# Patient Record
Sex: Female | Born: 1940 | Race: White | Hispanic: No | Smoking: Former smoker
Health system: Southern US, Community
[De-identification: ages and names within clinical notes are randomized; demographics above are authoritative.]

## PROBLEM LIST (undated history)

## (undated) DIAGNOSIS — K219 Gastro-esophageal reflux disease without esophagitis: Secondary | ICD-10-CM

## (undated) DIAGNOSIS — R0602 Shortness of breath: Secondary | ICD-10-CM

## (undated) DIAGNOSIS — G4730 Sleep apnea, unspecified: Secondary | ICD-10-CM

## (undated) DIAGNOSIS — Z7983 Long term (current) use of bisphosphonates: Secondary | ICD-10-CM

## (undated) DIAGNOSIS — R51 Headache: Secondary | ICD-10-CM

## (undated) DIAGNOSIS — M1990 Unspecified osteoarthritis, unspecified site: Secondary | ICD-10-CM

## (undated) DIAGNOSIS — J189 Pneumonia, unspecified organism: Secondary | ICD-10-CM

## (undated) DIAGNOSIS — Z5181 Encounter for therapeutic drug level monitoring: Secondary | ICD-10-CM

## (undated) HISTORY — PX: DILATION AND CURETTAGE OF UTERUS: SHX78

## (undated) HISTORY — PX: ROTATOR CUFF REPAIR: SHX139

## (undated) HISTORY — PX: TONSILLECTOMY: SUR1361

## (undated) HISTORY — PX: JOINT REPLACEMENT: SHX530

---

## 2012-01-27 ENCOUNTER — Encounter (HOSPITAL_COMMUNITY): Payer: Self-pay

## 2012-02-07 ENCOUNTER — Encounter (HOSPITAL_COMMUNITY)
Admission: RE | Admit: 2012-02-07 | Discharge: 2012-02-07 | Disposition: A | Discharge status: Home / Self Care | Payer: Medicare Other | Source: Ambulatory Visit | Attending: Orthopedic Surgery | Admitting: Orthopedic Surgery

## 2012-02-07 ENCOUNTER — Encounter (HOSPITAL_COMMUNITY): Payer: Self-pay

## 2012-02-07 ENCOUNTER — Encounter (HOSPITAL_COMMUNITY)
Admission: RE | Admit: 2012-02-07 | Discharge: 2012-02-07 | Disposition: A | Discharge status: Home / Self Care | Payer: Medicare Other | Source: Ambulatory Visit | Attending: Anesthesiology | Admitting: Anesthesiology

## 2012-02-07 ENCOUNTER — Other Ambulatory Visit: Payer: Self-pay

## 2012-02-07 HISTORY — DX: Headache: R51

## 2012-02-07 HISTORY — DX: Unspecified osteoarthritis, unspecified site: M19.90

## 2012-02-07 HISTORY — DX: Encounter for therapeutic drug level monitoring: Z51.81

## 2012-02-07 HISTORY — DX: Gastro-esophageal reflux disease without esophagitis: K21.9

## 2012-02-07 HISTORY — DX: Sleep apnea, unspecified: G47.30

## 2012-02-07 HISTORY — DX: Shortness of breath: R06.02

## 2012-02-07 HISTORY — DX: Encounter for therapeutic drug level monitoring: Z79.83

## 2012-02-07 HISTORY — DX: Pneumonia, unspecified organism: J18.9

## 2012-02-07 LAB — BASIC METABOLIC PANEL
BUN: 8 mg/dL (ref 6–23)
Chloride: 102 mEq/L (ref 96–112)
GFR calc Af Amer: >90 mL/min (ref >90)
Potassium: 3.9 mEq/L (ref 3.5–5.1)

## 2012-02-07 LAB — TYPE AND SCREEN: Antibody Screen: NEGATIVE

## 2012-02-07 LAB — CBC
HCT: 39.1 % (ref 36.0–46.0)
Hemoglobin: 13.5 g/dL (ref 12.0–15.0)
WBC: 5.1 10*3/uL (ref 4.0–10.5)

## 2012-02-07 LAB — ABO/RH: ABO/RH(D): A POS

## 2012-02-07 NOTE — Progress Notes (Signed)
Requested last office note, pulmonary tests, sleep study from [facility] in [place].

## 2012-02-07 NOTE — Pre-Procedure Instructions (Signed)
20 Jackie Keller  02/07/2012   Your procedure is scheduled on:   Thursday  02/09/12    Report to [facility] at 830  AM.  Call this number if you have problems the morning of surgery: [phone]   Remember:   Do not eat food:After Midnight.  May have clear liquids: up to 4 Hours before arrival.  Clear liquids include soda, tea, black coffee, apple or grape juice, broth.  Take these medicines the morning of surgery with A SIP OF WATER:  Tylenol , inhaler (albuterol) , nebulizer, symbicort, nexium, flonase   Do not wear jewelry, make-up or nail polish.  Do not wear lotions, powders, or perfumes. You may wear deodorant.  Do not shave 48 hours prior to surgery.  Do not bring valuables to the hospital.  Contacts, dentures or bridgework may not be worn into surgery.  Leave suitcase in the car. After surgery it may be brought to your room.  For patients admitted to the hospital, checkout time is 11:00 AM the day of discharge.   Patients discharged the day of surgery will not be allowed to drive home.  Name and phone number of your driver:   Special Instructions: CHG Shower Use Special Wash: 1/2 bottle night before surgery and 1/2 bottle morning of surgery.   Please read over the following fact sheets that you were given: Pain Booklet, MRSA Information and Surgical Site Infection Prevention

## 2012-02-08 MED ORDER — VANCOMYCIN HCL IN DEXTROSE 1-5 GM/200ML-% IV SOLN
1000.0000 mg | Freq: Once | INTRAVENOUS | Status: AC
  Administered 2012-02-09: 1000 mg via INTRAVENOUS
  Filled 2012-02-08: qty 200

## 2012-02-08 MED ORDER — LACTATED RINGERS IV SOLN
INTRAVENOUS | Status: DC
  Administered 2012-02-09: 10:00:00 via INTRAVENOUS

## 2012-02-08 MED ORDER — CHLORHEXIDINE GLUCONATE 4 % EX LIQD
60.0000 mL | Freq: Once | CUTANEOUS | Status: DC
  Filled 2012-02-08: qty 60

## 2012-02-09 ENCOUNTER — Encounter (HOSPITAL_COMMUNITY): Payer: Self-pay | Admitting: Anesthesiology

## 2012-02-09 ENCOUNTER — Encounter (HOSPITAL_COMMUNITY)
Admission: RE | Disposition: A | Discharge status: Home Health | Payer: Self-pay | Source: Ambulatory Visit | Attending: Orthopedic Surgery

## 2012-02-09 ENCOUNTER — Encounter (HOSPITAL_COMMUNITY): Payer: Self-pay | Admitting: *Deleted

## 2012-02-09 ENCOUNTER — Inpatient Hospital Stay (HOSPITAL_COMMUNITY)
Admission: RE | Admit: 2012-02-09 | Discharge: 2012-02-11 | DRG: 484 | Disposition: A | Discharge status: Home Health | Payer: Medicare Other | Source: Ambulatory Visit | Attending: Orthopedic Surgery | Admitting: Orthopedic Surgery

## 2012-02-09 ENCOUNTER — Ambulatory Visit (HOSPITAL_COMMUNITY): Payer: Medicare Other | Admitting: Anesthesiology

## 2012-02-09 DIAGNOSIS — K219 Gastro-esophageal reflux disease without esophagitis: Secondary | ICD-10-CM | POA: Diagnosis present

## 2012-02-09 DIAGNOSIS — M1990 Unspecified osteoarthritis, unspecified site: Secondary | ICD-10-CM

## 2012-02-09 DIAGNOSIS — Z888 Allergy status to other drugs, medicaments and biological substances status: Secondary | ICD-10-CM

## 2012-02-09 DIAGNOSIS — J45909 Unspecified asthma, uncomplicated: Secondary | ICD-10-CM | POA: Diagnosis present

## 2012-02-09 DIAGNOSIS — Z96659 Presence of unspecified artificial knee joint: Secondary | ICD-10-CM

## 2012-02-09 DIAGNOSIS — X58XXXA Exposure to other specified factors, initial encounter: Secondary | ICD-10-CM | POA: Diagnosis present

## 2012-02-09 DIAGNOSIS — G43909 Migraine, unspecified, not intractable, without status migrainosus: Secondary | ICD-10-CM | POA: Diagnosis present

## 2012-02-09 DIAGNOSIS — M810 Age-related osteoporosis without current pathological fracture: Secondary | ICD-10-CM | POA: Diagnosis present

## 2012-02-09 DIAGNOSIS — G4730 Sleep apnea, unspecified: Secondary | ICD-10-CM | POA: Diagnosis present

## 2012-02-09 DIAGNOSIS — M129 Arthropathy, unspecified: Secondary | ICD-10-CM | POA: Diagnosis present

## 2012-02-09 DIAGNOSIS — S43429A Sprain of unspecified rotator cuff capsule, initial encounter: Principal | ICD-10-CM | POA: Diagnosis present

## 2012-02-09 HISTORY — PX: REVERSE SHOULDER ARTHROPLASTY: SHX5054

## 2012-02-09 SURGERY — ARTHROPLASTY, SHOULDER, TOTAL, REVERSE
Anesthesia: General | Site: Shoulder | Laterality: Right | Wound class: Clean

## 2012-02-09 MED ORDER — ONDANSETRON HCL 4 MG PO TABS: 4.0000 mg | ORAL_TABLET | Freq: Four times a day (QID) | ORAL | Status: DC | PRN

## 2012-02-09 MED ORDER — BUDESONIDE-FORMOTEROL FUMARATE 80-4.5 MCG/ACT IN AERO
2.0000 | INHALATION_SPRAY | Freq: Two times a day (BID) | RESPIRATORY_TRACT | Status: DC
  Filled 2012-02-09: qty 6.9

## 2012-02-09 MED ORDER — SODIUM CHLORIDE 0.9 % IR SOLN
Status: DC | PRN
  Administered 2012-02-09: 1000 mL

## 2012-02-09 MED ORDER — LACTATED RINGERS IV SOLN
INTRAVENOUS | Status: DC
  Administered 2012-02-10: 01:00:00 via INTRAVENOUS

## 2012-02-09 MED ORDER — PHENYLEPHRINE HCL 10 MG/ML IJ SOLN
INTRAMUSCULAR | Status: DC | PRN
  Administered 2012-02-09 (×4): 80 ug via INTRAVENOUS

## 2012-02-09 MED ORDER — MIDAZOLAM HCL 2 MG/2ML IJ SOLN
INTRAMUSCULAR | Status: AC
  Filled 2012-02-09: qty 2

## 2012-02-09 MED ORDER — HYDROMORPHONE HCL PF 1 MG/ML IJ SOLN: 0.2500 mg | INTRAMUSCULAR | Status: DC | PRN

## 2012-02-09 MED ORDER — HYDROMORPHONE HCL 2 MG PO TABS
2.0000 mg | ORAL_TABLET | ORAL | Status: DC | PRN
  Administered 2012-02-10 – 2012-02-11 (×4): 2 mg via ORAL
  Administered 2012-02-11: 4 mg via ORAL
  Filled 2012-02-09 (×5): qty 1
  Filled 2012-02-09: qty 2

## 2012-02-09 MED ORDER — MIDAZOLAM HCL 2 MG/2ML IJ SOLN
2.0000 mg | INTRAMUSCULAR | Status: DC | PRN
  Administered 2012-02-09: 1 mg via INTRAVENOUS

## 2012-02-09 MED ORDER — MENTHOL 3 MG MT LOZG: 1.0000 | LOZENGE | OROMUCOSAL | Status: DC | PRN

## 2012-02-09 MED ORDER — BUPIVACAINE-EPINEPHRINE PF 0.5-1:200000 % IJ SOLN
INTRAMUSCULAR | Status: DC | PRN
  Administered 2012-02-09: 30 mL

## 2012-02-09 MED ORDER — POLYETHYLENE GLYCOL 3350 17 G PO PACK
17.0000 g | PACK | Freq: Every day | ORAL | Status: DC
  Administered 2012-02-11: 17 g via ORAL
  Filled 2012-02-09 (×2): qty 1

## 2012-02-09 MED ORDER — FENTANYL CITRATE 0.05 MG/ML IJ SOLN
INTRAMUSCULAR | Status: DC | PRN
  Administered 2012-02-09: 50 ug via INTRAVENOUS

## 2012-02-09 MED ORDER — SUCCINYLCHOLINE CHLORIDE 20 MG/ML IJ SOLN
INTRAMUSCULAR | Status: DC | PRN
  Administered 2012-02-09: 80 mg via INTRAVENOUS

## 2012-02-09 MED ORDER — VANCOMYCIN HCL IN DEXTROSE 1-5 GM/200ML-% IV SOLN
1000.0000 mg | Freq: Two times a day (BID) | INTRAVENOUS | Status: AC
  Administered 2012-02-09: 1000 mg via INTRAVENOUS
  Filled 2012-02-09: qty 200

## 2012-02-09 MED ORDER — METHOCARBAMOL 500 MG PO TABS
500.0000 mg | ORAL_TABLET | Freq: Four times a day (QID) | ORAL | Status: DC | PRN
  Filled 2012-02-09: qty 1

## 2012-02-09 MED ORDER — METOCLOPRAMIDE HCL 5 MG/ML IJ SOLN
5.0000 mg | Freq: Three times a day (TID) | INTRAMUSCULAR | Status: DC | PRN
  Administered 2012-02-10 – 2012-02-11 (×2): 10 mg via INTRAVENOUS
  Filled 2012-02-09 (×2): qty 2

## 2012-02-09 MED ORDER — METOCLOPRAMIDE HCL 10 MG PO TABS: 5.0000 mg | ORAL_TABLET | Freq: Three times a day (TID) | ORAL | Status: DC | PRN

## 2012-02-09 MED ORDER — ONDANSETRON HCL 4 MG/2ML IJ SOLN
4.0000 mg | Freq: Four times a day (QID) | INTRAMUSCULAR | Status: DC | PRN
  Administered 2012-02-09: 4 mg via INTRAVENOUS

## 2012-02-09 MED ORDER — PHENOL 1.4 % MT LIQD: 1.0000 | OROMUCOSAL | Status: DC | PRN

## 2012-02-09 MED ORDER — FENTANYL CITRATE 0.05 MG/ML IJ SOLN
100.0000 ug | INTRAMUSCULAR | Status: DC | PRN
  Administered 2012-02-09: 50 ug via INTRAVENOUS

## 2012-02-09 MED ORDER — ALBUTEROL SULFATE (5 MG/ML) 0.5% IN NEBU: 2.5000 mg | INHALATION_SOLUTION | RESPIRATORY_TRACT | Status: DC | PRN

## 2012-02-09 MED ORDER — LACTATED RINGERS IV SOLN
INTRAVENOUS | Status: DC | PRN
  Administered 2012-02-09 (×2): via INTRAVENOUS

## 2012-02-09 MED ORDER — PANTOPRAZOLE SODIUM 40 MG PO TBEC
40.0000 mg | DELAYED_RELEASE_TABLET | Freq: Every day | ORAL | Status: DC
  Administered 2012-02-10 – 2012-02-11 (×2): 40 mg via ORAL
  Filled 2012-02-09 (×2): qty 1

## 2012-02-09 MED ORDER — ACETAMINOPHEN 325 MG PO TABS
650.0000 mg | ORAL_TABLET | Freq: Four times a day (QID) | ORAL | Status: DC | PRN
  Administered 2012-02-10 – 2012-02-11 (×3): 650 mg via ORAL
  Filled 2012-02-09 (×3): qty 2

## 2012-02-09 MED ORDER — MONTELUKAST SODIUM 10 MG PO TABS
10.0000 mg | ORAL_TABLET | Freq: Every day | ORAL | Status: DC
  Administered 2012-02-09 – 2012-02-10 (×2): 10 mg via ORAL
  Filled 2012-02-09 (×3): qty 1

## 2012-02-09 MED ORDER — ACETAMINOPHEN 650 MG RE SUPP: 650.0000 mg | Freq: Four times a day (QID) | RECTAL | Status: DC | PRN

## 2012-02-09 MED ORDER — METHOCARBAMOL 100 MG/ML IJ SOLN
500.0000 mg | Freq: Four times a day (QID) | INTRAMUSCULAR | Status: DC | PRN
  Filled 2012-02-09: qty 5

## 2012-02-09 MED ORDER — LIDOCAINE HCL 4 % MT SOLN
OROMUCOSAL | Status: DC | PRN
  Administered 2012-02-09: 4 mL via TOPICAL

## 2012-02-09 MED ORDER — FENTANYL CITRATE 0.05 MG/ML IJ SOLN
INTRAMUSCULAR | Status: AC
  Filled 2012-02-09: qty 2

## 2012-02-09 MED ORDER — WHITE PETROLATUM GEL
Status: AC
  Filled 2012-02-09: qty 5

## 2012-02-09 MED ORDER — ONDANSETRON HCL 4 MG/2ML IJ SOLN
INTRAMUSCULAR | Status: DC | PRN
  Administered 2012-02-09: 4 mg via INTRAVENOUS

## 2012-02-09 MED ORDER — KETOROLAC TROMETHAMINE 15 MG/ML IJ SOLN
15.0000 mg | Freq: Four times a day (QID) | INTRAMUSCULAR | Status: DC
  Administered 2012-02-09 – 2012-02-11 (×7): 15 mg via INTRAVENOUS
  Filled 2012-02-09 (×11): qty 1

## 2012-02-09 MED ORDER — PROPOFOL 10 MG/ML IV EMUL
INTRAVENOUS | Status: DC | PRN
  Administered 2012-02-09: 100 mg via INTRAVENOUS

## 2012-02-09 MED ORDER — ALBUTEROL SULFATE HFA 108 (90 BASE) MCG/ACT IN AERS
2.0000 | INHALATION_SPRAY | Freq: Four times a day (QID) | RESPIRATORY_TRACT | Status: DC | PRN
  Filled 2012-02-09: qty 6.7

## 2012-02-09 MED ORDER — LIDOCAINE HCL (CARDIAC) 20 MG/ML IV SOLN
INTRAVENOUS | Status: DC | PRN
  Administered 2012-02-09: 20 mg via INTRAVENOUS

## 2012-02-09 MED ORDER — HYDROMORPHONE HCL PF 1 MG/ML IJ SOLN
0.5000 mg | INTRAMUSCULAR | Status: DC | PRN
  Administered 2012-02-10: 0.5 mg via INTRAVENOUS
  Administered 2012-02-10 (×2): 1 mg via INTRAVENOUS
  Administered 2012-02-10: 0.5 mg via INTRAVENOUS
  Administered 2012-02-10: 1 mg via INTRAVENOUS
  Filled 2012-02-09 (×5): qty 1

## 2012-02-09 MED ORDER — SODIUM CHLORIDE 0.9 % IV SOLN
10.0000 mg | INTRAVENOUS | Status: DC | PRN
  Administered 2012-02-09: 1 ug/min via INTRAVENOUS

## 2012-02-09 SURGICAL SUPPLY — 79 items
BIT DRILL 170X2.5X (BIT) IMPLANT
BIT DRL 170X2.5X (BIT)
BLADE SAW SGTL 83.5X18.5 (BLADE) ×2 IMPLANT
BRUSH FEMORAL CANAL (MISCELLANEOUS) IMPLANT
CEMENT BONE DEPUY (Cement) ×4 IMPLANT
CEMENT RESTRICTOR DEPUY SZ 3 (Cement) ×2 IMPLANT
CLOTH BEACON ORANGE TIMEOUT ST (SAFETY) ×2 IMPLANT
COVER SURGICAL LIGHT HANDLE (MISCELLANEOUS) ×2 IMPLANT
CUP D38 DXTEND STAND PLUS 6 HU (Orthopedic Implant) ×2 IMPLANT
CUP STD D38 DXTEND PLUS 6 HU (Orthopedic Implant) ×1 IMPLANT
DRAPE INCISE IOBAN 66X45 STRL (DRAPES) ×2 IMPLANT
DRAPE SURG 17X11 SM STRL (DRAPES) ×2 IMPLANT
DRAPE U-SHAPE 47X51 STRL (DRAPES) ×2 IMPLANT
DRILL 2.5 (BIT)
DRILL BIT 7/64X5 (BIT) ×2 IMPLANT
DRSG MEPILEX BORDER 4X8 (GAUZE/BANDAGES/DRESSINGS) ×2 IMPLANT
DURAPREP 26ML APPLICATOR (WOUND CARE) ×2 IMPLANT
ELECT BLADE 4.0 EZ CLEAN MEGAD (MISCELLANEOUS) ×2
ELECT CAUTERY BLADE 6.4 (BLADE) ×2 IMPLANT
ELECT REM PT RETURN 9FT ADLT (ELECTROSURGICAL) ×2
ELECTRODE BLDE 4.0 EZ CLN MEGD (MISCELLANEOUS) ×1 IMPLANT
ELECTRODE REM PT RTRN 9FT ADLT (ELECTROSURGICAL) ×1 IMPLANT
FACESHIELD LNG OPTICON STERILE (SAFETY) ×6 IMPLANT
GLOVE BIO SURGEON STRL SZ7.5 (GLOVE) ×4 IMPLANT
GLOVE BIO SURGEON STRL SZ8 (GLOVE) ×2 IMPLANT
GLOVE BIOGEL PI IND STRL 6.5 (GLOVE) ×1 IMPLANT
GLOVE BIOGEL PI INDICATOR 6.5 (GLOVE) ×1
GLOVE ECLIPSE 8.5 STRL (GLOVE) ×2 IMPLANT
GLOVE EUDERMIC 7 POWDERFREE (GLOVE) ×2 IMPLANT
GLOVE SS BIOGEL STRL SZ 7.5 (GLOVE) ×1 IMPLANT
GLOVE SS BIOGEL STRL SZ 8.5 (GLOVE) ×1 IMPLANT
GLOVE SUPERSENSE BIOGEL SZ 7.5 (GLOVE) ×1
GLOVE SUPERSENSE BIOGEL SZ 8.5 (GLOVE) ×1
GLOVE SURG SS PI 8.5 STRL IVOR (GLOVE) ×1
GLOVE SURG SS PI 8.5 STRL STRW (GLOVE) ×1 IMPLANT
GOWN PREVENTION PLUS XXLARGE (GOWN DISPOSABLE) ×2 IMPLANT
GOWN STRL NON-REIN LRG LVL3 (GOWN DISPOSABLE) ×2 IMPLANT
GOWN STRL REIN XL XLG (GOWN DISPOSABLE) ×4 IMPLANT
HANDPIECE INTERPULSE COAX TIP (DISPOSABLE)
HUMERAL SPACER (Trauma) ×2 IMPLANT
KIT BASIN OR (CUSTOM PROCEDURE TRAY) ×2 IMPLANT
KIT ROOM TURNOVER OR (KITS) ×2 IMPLANT
MANIFOLD NEPTUNE II (INSTRUMENTS) ×2 IMPLANT
NDL SUT 6 .5 CRC .975X.05 MAYO (NEEDLE) IMPLANT
NEEDLE HYPO 25GX1X1/2 BEV (NEEDLE) IMPLANT
NEEDLE MAYO TAPER (NEEDLE)
NS IRRIG 1000ML POUR BTL (IV SOLUTION) ×2 IMPLANT
PACK SHOULDER (CUSTOM PROCEDURE TRAY) ×2 IMPLANT
PAD ARMBOARD 7.5X6 YLW CONV (MISCELLANEOUS) ×4 IMPLANT
PASSER SUT SWANSON 36MM LOOP (INSTRUMENTS) IMPLANT
PIN GUIDE 1.2 (PIN) IMPLANT
PIN GUIDE GLENOPHERE 1.5MX300M (PIN) IMPLANT
PIN METAGLENE 2.5 (PIN) IMPLANT
PRESSURIZER FEMORAL UNIV (MISCELLANEOUS) IMPLANT
SET HNDPC FAN SPRY TIP SCT (DISPOSABLE) IMPLANT
SLING ARM IMMOBILIZER LRG (SOFTGOODS) IMPLANT
SLING ARM LGE (CAST SUPPLIES) ×2 IMPLANT
SPACER HUMERAL (Trauma) ×1 IMPLANT
SPONGE LAP 18X18 X RAY DECT (DISPOSABLE) ×2 IMPLANT
SPONGE LAP 4X18 X RAY DECT (DISPOSABLE) ×2 IMPLANT
STRIP CLOSURE SKIN 1/2X4 (GAUZE/BANDAGES/DRESSINGS) ×2 IMPLANT
SUCTION FRAZIER TIP 10 FR DISP (SUCTIONS) ×2 IMPLANT
SUT BONE WAX W31G (SUTURE) IMPLANT
SUT FIBERWIRE #2 38 T-5 BLUE (SUTURE) ×4
SUT MNCRL AB 3-0 PS2 18 (SUTURE) ×2 IMPLANT
SUT VIC AB 1 CT1 27 (SUTURE) ×2
SUT VIC AB 1 CT1 27XBRD ANBCTR (SUTURE) ×2 IMPLANT
SUT VIC AB 2-0 CT1 27 (SUTURE) ×3
SUT VIC AB 2-0 CT1 TAPERPNT 27 (SUTURE) ×3 IMPLANT
SUT VIC AB 2-0 SH 27 (SUTURE)
SUT VIC AB 2-0 SH 27X BRD (SUTURE) IMPLANT
SUTURE FIBERWR #2 38 T-5 BLUE (SUTURE) ×2 IMPLANT
SYR 30ML SLIP (SYRINGE) IMPLANT
SYR CONTROL 10ML LL (SYRINGE) IMPLANT
TOWEL OR 17X24 6PK STRL BLUE (TOWEL DISPOSABLE) ×2 IMPLANT
TOWEL OR 17X26 10 PK STRL BLUE (TOWEL DISPOSABLE) ×2 IMPLANT
TOWER CARTRIDGE SMART MIX (DISPOSABLE) ×2 IMPLANT
TRAY FOLEY CATH 14FR (SET/KITS/TRAYS/PACK) IMPLANT
WATER STERILE IRR 1000ML POUR (IV SOLUTION) ×2 IMPLANT

## 2012-02-09 NOTE — Progress Notes (Signed)
Lunch relief by Merri Brunette  RN

## 2012-02-09 NOTE — Op Note (Signed)
02/09/2012  12:12 PM  PATIENT:   Jackie Keller  71 y.o. female  PRE-OPERATIVE DIAGNOSIS:  RIGHT SHOULDER ROTATOR CUFF TEAR AUTHROPATHY  POST-OPERATIVE DIAGNOSIS:  Same  PROCEDURE:  Right shoulder reverse arthroplasty 10/1 stem, 38 eccentric glenosphere  SURGEON:  Consandra Laske, Vania Rea M.D.  ASSISTANTS: Shuford pac   ANESTHESIA:   GET + ISB  EBL: 250  SPECIMEN:  none  Drains: none   PATIENT DISPOSITION:  PACU - hemodynamically stable.    PLAN OF CARE: Admit to inpatient   Dictation# [id number]

## 2012-02-09 NOTE — Anesthesia Procedure Notes (Addendum)
Anesthesia Regional Block:  Interscalene brachial plexus block  Pre-Anesthetic Checklist: ,, timeout performed, Correct Patient, Correct Site, Correct Laterality, Correct Procedure, Correct Position, site marked, Risks and benefits discussed, pre-op evaluation,  At surgeon's request and post-op pain management  Laterality: Right  Prep: Maximum Sterile Barrier Precautions used and chloraprep       Needles:  Injection technique: Single-shot  Needle Type: Echogenic Stimulator Needle      Needle Gauge: 22 and 22 G    Additional Needles:  Procedures: ultrasound guided and nerve stimulator Interscalene brachial plexus block  Nerve Stimulator or Paresthesia:  Response: Biceps response, 0.4 mA,   Additional Responses:   Narrative:  Start time: 02/09/2012 9:33 AM End time: 02/09/2012 9:50 AM Injection made incrementally with aspirations every 5 mL. Anesthesiologist: Sampson Goon, MD  Additional Notes: 2% Lidocaine skin wheel.   Interscalene brachial plexus block Procedure Name: Intubation Date/Time: 02/09/2012 10:26 AM Performed by: Caryn Bee Pre-anesthesia Checklist: Patient identified, Emergency Drugs available, Suction available, Patient being monitored and Timeout performed Patient Re-evaluated:Patient Re-evaluated prior to inductionOxygen Delivery Method: Circle system utilized Preoxygenation: Pre-oxygenation with 100% oxygen Intubation Type: IV induction Ventilation: Mask ventilation without difficulty Laryngoscope Size: Mac and 3 Grade View: Grade I Tube type: Oral Tube size: 7.0 mm Number of attempts: 1 Airway Equipment and Method: Stylet Placement Confirmation: ETT inserted through vocal cords under direct vision,  positive ETCO2 and breath sounds checked- equal and bilateral Secured at: 21 cm Tube secured with: Tape Dental Injury: Teeth and Oropharynx as per pre-operative assessment

## 2012-02-09 NOTE — H&P (Signed)
[facility] Nurse    Chief Complaint: RIGHT SHOULDER ROTATOR CUFF TEAR AUTHROPATHY HPI: The patient is a 71 y.o. female with right shoulder rotator cuff tear arthropathy  Past Medical History  Diagnosis Date  . Asthma     DR Rutherford Nail  [place] [facility])  . Shortness of breath     WITH EXERTION   . Pneumonia     HX PNEUMONIA    . Sleep apnea     [facility] [place]  . GERD (gastroesophageal reflux disease)   . Arthritis   . Headache     HX MIGRAINES  . Osteoporosis   . Visit for monitoring Reclast therapy     TAKES RECLAST  X 1 YEAR    Past Surgical History  Procedure Date  . Joint replacement     BIL KNEE REPLACEMENT 07+08 FORSYTHE  . Rotator cuff repair     RIGHT SHOULDER  2007  . Dilation and curettage of uterus   . Tonsillectomy     PART OF TONSILS + ADENOIDS    History reviewed. No pertinent family history.  Social History:  reports that she has quit smoking. She does not have any smokeless tobacco history on file. She reports that she does not drink alcohol or use illicit drugs.  Allergies:  Allergies  Allergen Reactions  . Cephalosporins Anaphylaxis  . Glycopyrrolate Swelling    hives  . Methylprednisolone Hives  . Other Other (See Comments)    Chlor-trimeton causes her to have hiccups    Medications Prior to Admission  Medication Dose Route Frequency Provider Last Rate Last Dose  . chlorhexidine (HIBICLENS) 4 % liquid 4 application  60 mL Topical Once [employer], PA      . fentaNYL (SUBLIMAZE) injection 100 mcg  100 mcg Intravenous PRN W. Autumn Patty, MD      . lactated ringers infusion   Intravenous Continuous Ralene Bathe, PA      . midazolam (VERSED) injection 2 mg  2 mg Intravenous PRN Zenon Mayo, MD      . vancomycin (VANCOCIN) IVPB 1000 mg/200 mL premix  1,000 mg Intravenous Once [employer], PA       Medications Prior to Admission  Medication Sig Dispense Refill  . acetaminophen (TYLENOL) 500 MG tablet Take 1,000 mg  by mouth every 6 (six) hours as needed. For pain      . albuterol (PROVENTIL HFA;VENTOLIN HFA) 108 (90 BASE) MCG/ACT inhaler Inhale 2 puffs into the lungs every 6 (six) hours as needed. For wheezing and shortness of breath      . albuterol (PROVENTIL) (2.5 MG/3ML) 0.083% nebulizer solution Take 2.5 mg by nebulization every 6 (six) hours as needed. For wheezing or shortness of breath      . amoxicillin-clavulanate (AUGMENTIN XR) 1000-62.5 MG per tablet Take 2 tablets by mouth 2 (two) times daily. For 7 days      . Ascorbic Acid (VITAMIN C) 500 MG CHEW Chew 1 tablet by mouth 2 (two) times daily. May take an extra tablet if her throat starts to hurt      . budesonide-formoterol (SYMBICORT) 80-4.5 MCG/ACT inhaler Inhale 2 puffs into the lungs 2 (two) times daily.      . cholecalciferol (VITAMIN D) 400 UNITS TABS Take 800 Units by mouth 2 (two) times daily.      Marland Kitchen esomeprazole (NEXIUM) 40 MG capsule Take 40 mg by mouth daily before breakfast. Sometimes twice depending on how bad the heartburn.      Marland Kitchen  fluconazole (DIFLUCAN) 150 MG tablet Take 150 mg by mouth once as needed. For mouth sores.      . fluticasone (FLONASE) 50 MCG/ACT nasal spray Place 2 sprays into the nose daily as needed. For sinus      . HYDROmorphone (DILAUDID) 2 MG tablet Take 2 mg by mouth at bedtime as needed. For pain      . L-Lysine 500 MG TABS Take 2 tablets by mouth 2 (two) times daily.      . montelukast (SINGULAIR) 10 MG tablet Take 10 mg by mouth at bedtime.      . naproxen sodium (ANAPROX) 220 MG tablet Take 220 mg by mouth 2 (two) times daily with a meal.      . OVER THE COUNTER MEDICATION Take 2 tablets by mouth 2 (two) times daily. Calcimate Plus 800      . polyethylene glycol (MIRALAX / GLYCOLAX) packet Take 17 g by mouth daily. For constipation         Physical Exam: Right shoulder examined and unchanged from office visit 01/16/12 with severly painful and restricted range of motion  Vitals  Temp:  [97.9 F (36.6 C)]  97.9 F (36.6 C) (03/07 0834) Pulse Rate:  [85] 85  (03/07 0834) Resp:  [18] 18  (03/07 0834) BP: (165)/(74) 165/74 mmHg (03/07 0834) SpO2:  [95 %] 95 % (03/07 0834)  Assessment/Plan  Impression: RIGHT SHOULDER ROTATOR CUFF TEAR AUTHROPATHY  Plan of Action: Procedure(s): REVERSE SHOULDER ARTHROPLASTY  Jaryiah Mehlman M 02/09/2012, 9:32 AM

## 2012-02-09 NOTE — Anesthesia Preprocedure Evaluation (Signed)
Anesthesia Evaluation  Patient identified by MRN, date of birth, ID band Patient awake    Reviewed: Allergy & Precautions, H&P , NPO status , Patient's Chart, lab work & pertinent test results  Airway Mallampati: II TM Distance: >3 FB Neck ROM: Full    Dental No notable dental hx. (+) Teeth Intact   Pulmonary asthma , sleep apnea and Continuous Positive Airway Pressure Ventilation ,  breath sounds clear to auscultation  Pulmonary exam normal       Cardiovascular negative cardio ROS  Rhythm:Regular Rate:Normal     Neuro/Psych  Headaches, negative psych ROS   GI/Hepatic Neg liver ROS, GERD-  Medicated and Controlled,  Endo/Other  negative endocrine ROS  Renal/GU negative Renal ROS  negative genitourinary   Musculoskeletal   Abdominal   Peds  Hematology negative hematology ROS (+)   Anesthesia Other Findings   Reproductive/Obstetrics negative OB ROS                           Anesthesia Physical Anesthesia Plan  ASA: III  Anesthesia Plan: General   Post-op Pain Management:    Induction: Intravenous  Airway Management Planned: Oral ETT  Additional Equipment:   Intra-op Plan:   Post-operative Plan: Extubation in OR  Informed Consent: I have reviewed the patients History and Physical, chart, labs and discussed the procedure including the risks, benefits and alternatives for the proposed anesthesia with the patient or authorized representative who has indicated his/her understanding and acceptance.     Plan Discussed with: CRNA  Anesthesia Plan Comments:         Anesthesia Quick Evaluation

## 2012-02-09 NOTE — Preoperative (Signed)
Beta Blockers   Reason not to administer Beta Blockers:Not Applicable 

## 2012-02-09 NOTE — Anesthesia Postprocedure Evaluation (Signed)
  Anesthesia Post-op Note  Patient: Jackie Keller  Procedure(s) Performed: Procedure(s) (LRB): REVERSE SHOULDER ARTHROPLASTY (Right)  Patient Location: PACU  Anesthesia Type: GA combined with regional for post-op pain  Level of Consciousness: awake, alert  and oriented  Airway and Oxygen Therapy: Patient Spontanous Breathing  Post-op Pain: mild  Post-op Assessment: Post-op Vital signs reviewed, Patient's Cardiovascular Status Stable, Respiratory Function Stable, Patent Airway, No signs of Nausea or vomiting and Pain level controlled  Post-op Vital Signs: Reviewed and stable  Complications: No apparent anesthesia complications

## 2012-02-09 NOTE — Transfer of Care (Signed)
Immediate Anesthesia Transfer of Care Note  Patient: Jackie Keller  Procedure(s) Performed: Procedure(s) (LRB): REVERSE SHOULDER ARTHROPLASTY (Right)  Patient Location: PACU  Anesthesia Type: General  Level of Consciousness: awake, alert  and oriented  Airway & Oxygen Therapy: Patient Spontanous Breathing and Patient connected to nasal cannula oxygen  Post-op Assessment: Report given to PACU RN and Post -op Vital signs reviewed and stable  Post vital signs: Reviewed and stable  Complications: No apparent anesthesia complications

## 2012-02-10 ENCOUNTER — Encounter (HOSPITAL_COMMUNITY): Payer: Self-pay | Admitting: Orthopedic Surgery

## 2012-02-10 MED ORDER — DIAZEPAM 5 MG PO TABS
5.0000 mg | ORAL_TABLET | Freq: Four times a day (QID) | ORAL | Status: DC | PRN
  Administered 2012-02-10 – 2012-02-11 (×3): 5 mg via ORAL
  Filled 2012-02-10 (×4): qty 1

## 2012-02-10 MED ORDER — HYDROMORPHONE HCL 2 MG PO TABS: 2.0000 mg | ORAL_TABLET | ORAL | Status: AC | PRN

## 2012-02-10 MED ORDER — DIAZEPAM 5 MG PO TABS: 5.0000 mg | ORAL_TABLET | Freq: Four times a day (QID) | ORAL | Status: AC | PRN

## 2012-02-10 NOTE — Progress Notes (Signed)
UR COMPLETED  

## 2012-02-10 NOTE — Op Note (Signed)
NAMELAUNA, GOEDKEN NO.:  [id number]  MEDICAL RECORD NO.:  [id number]  LOCATION:  5019                         FACILITY:  [facility]  PHYSICIAN:  Vania Rea. Jourdin Gens, M.D.  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  PROCEDURE:  A right shoulder reverse arthroplasty utilizing a cemented size 10/1 DePuy stem and a 38 eccentric glenosphere.  SURGEON:  Vania Rea. Andrewjames Weirauch, MD  ASSISTANT:  Lucita Lora. Shuford, PA-C  ANESTHESIA:  A general endotracheal as well as interscalene block.  ESTIMATED BLOOD LOSS:  250 mL.  DRAINS:  None.  HISTORY:  Ms. Denard is a 71 year old female who has had a previous rotator cuff repair, which is unfortunately failed and now has an end- stage right shoulder rotator cuff tear arthropathy.  Due to her increasing pain as well as functional limitations, she is brought to the operating room at this time for planned right reverse shoulder arthroplasty.  Preoperatively, I counseled Ms. Searing on treatment options as well as risks versus benefits thereof.  Possible surgical complications were reviewed including the potential for bleeding, infection, neurovascular injury, persistent pain, loss of motion, anesthetic complication, failure of the implant, and possible need for additional surgery.  She understands and accepts and agrees with our planned procedure.  PROCEDURE IN DETAIL:  After undergoing routine preop evaluation, the patient received prophylactic antibiotics and an interscalene block was established in the holding area by the Anesthesia Department.  Placed supine on the operating table, underwent smooth induction of a general endotracheal anesthesia.  Placed in the beach-chair position  and appropriately padded and protected.  The right shoulder girdle region was then sterilely prepped and draped in standard fashion.  Time-out was called.  An anterior deltopectoral approach, the right shoulder was made through a 12-cm incision.  Skin flaps were elevated and electrocautery was used for hemostasis.  Dissection was carried deeply.  Deltopectoral interval was identified and developed bluntly with the cephalic vein retracted laterally.  The upper centimeter of the pec major was tenotomized to improve visualization and adhesions throughout the subacromial/subdeltoid bursa were divided.  Conjoined tendon was identified and mobilized medially and the humeral head had escape to anterosuperiorly and the remnants of the cuff were divided anteriorly allowing delivery of the humeral head through the wound and the bone was completely eburnated both medially and superiorly.  Once the head was delivered, we gained access to the humeral medullary canal, then reamed up to size 10.  Off the intramedullary guide, we then estimated the appropriate proximal humeral resection level, and this was then completed using an oscillating saw.  Metal cap was placed across the cut surface of the proximal humerus, and we then exposed the glenoid with combination of Fukuda pitchfork and snake tongue retractors.  We performed a circumferential resection of the labrum and released the capsular tissue such that the entirety of the glenoid rim could be visualized.  We then placed our guidepin at the appropriate point  on the center of the glenoid and then reamed to a subchondral bony bed and then used a peripheral reamer to remove any residual peripheral bone. Central drill hole was then placed and the glenoid baseplate was then impacted into position and then it was transfixed using locking screws inferiorly, superiorly as well as anteriorly and then a posterior nonlocking screw with good purchase obtained  on all screws and then final locking of the locking screws was completed with her overall stability much to our satisfaction.  We then placed the 38 eccentric glenosphere onto the baseplate and this was screwed, re-impacted and tightened once again with good stability achieved.  At this point, we then returned our attention to the proximal humerus where the canal was irrigated.  We placed the trial stem and reamed the metaphyseal recess at 0 degrees of retroversion and a trial was then performed showing good soft tissue balance.  We placed a distal cement plug with the canal was irrigated, dried, cement was mixed and introduced into the humeral canal for appropriate consistency and the final stem was then impacted into position, size 10/1, and all extra cement was meticulously removed. This was at 0 degrees of retroversion.  Once the cement had hardened, we performed trial reductions and initially appeared as though a +6 would be the appropriate neck length, although one impaction of the 6 poly, the shoulder was obviously loose, so the initial 6 poly was removed and we performed an additional trialing and ultimately we utilized a +9 metal spacer and a +3 poly and this gave [place] excellent soft tissue balance and good stability of the shoulder with excellent motion.  These final implants were impacted into position.  Final irrigation and inspection was completed.  Hemostasis was obtained.  The deltopectoral interval was then reapproximated with interrupted figure-of-eight #1 Vicryl sutures.  A 2-0 Vicryl used for the subcu layer and intracuticular 3-0 Monocryl for the skin followed by Steri-Strips.  Dry dressing was applied.  Right arm was placed into sling.  The patient was then awakened, extubated, and taken to the recovery room in stable condition.     Vania Rea. Mishelle Hassan, M.D.     KMS/MEDQ  D:  02/09/2012  T:  02/10/2012  Job:  [id number]

## 2012-02-10 NOTE — Progress Notes (Signed)
Occupational Therapy Evaluation Patient Details Name: Jackie Keller MRN: [id number] DOB: 12-19-1940 Today's Date: 02/10/2012  Problem List: There is no problem list on file for this patient.   Past Medical History:  Past Medical History  Diagnosis Date  . Asthma     DR Rutherford Nail  [facility])  . Shortness of breath     WITH EXERTION   . Pneumonia     HX PNEUMONIA    . Sleep apnea     [facility] [place]  . GERD (gastroesophageal reflux disease)   . Arthritis   . Headache     HX MIGRAINES  . Osteoporosis   . Visit for monitoring Reclast therapy     TAKES RECLAST  X 1 YEAR   Past Surgical History:  Past Surgical History  Procedure Date  . Joint replacement     BIL KNEE REPLACEMENT 07+08 FORSYTHE  . Rotator cuff repair     RIGHT SHOULDER  2007  . Dilation and curettage of uterus   . Tonsillectomy     PART OF TONSILS + ADENOIDS    OT Assessment/Plan/Recommendation OT Assessment Clinical Impression Statement: 71 yo s/p R reverse shoudler. Pt will benefit from OT services to max indep with ADL, functional mobility for ADL and functional use RUE. Pt limited primarily by pain this session. Will attempt to see agian today. Pt lives alone and needs to be at a Mod I level with ADL before D/C. If pt progresses well, she may be able to D/C tomorrow after therapy. If not, pt may need to D/C Saturday.  Will need HHOT services. OT Recommendation/Assessment: Patient will need skilled OT in the acute care venue OT Problem List: Decreased strength;Decreased range of motion;Decreased activity tolerance;Decreased knowledge of use of DME or AE;Decreased knowledge of precautions;Impaired UE functional use;Pain Barriers to Discharge: Decreased caregiver support OT Therapy Diagnosis : Generalized weakness;Acute pain OT Plan OT Frequency: Min 2X/week OT Treatment/Interventions: Self-care/ADL training;Therapeutic exercise;Energy conservation;DME and/or AE instruction;Therapeutic  activities;Patient/family education OT Recommendation Follow Up Recommendations: Home health OT Equipment Recommended: None recommended by OT Individuals Consulted Consulted and Agree with Results and Recommendations: Patient OT Goals Acute Rehab OT Goals OT Goal Formulation: With patient Time For Goal Achievement: 7 days ADL Goals Pt Will Perform Grooming: with modified independence;Standing at sink ADL Goal: Grooming - Progress: Goal set today Pt Will Perform Upper Body Bathing: with modified independence;Sitting at sink ADL Goal: Upper Body Bathing - Progress: Goal set today Pt Will Perform Lower Body Bathing: with modified independence;Sit to stand from chair ADL Goal: Lower Body Bathing - Progress: Goal set today Pt Will Perform Upper Body Dressing: with modified independence;Sitting, chair ADL Goal: Upper Body Dressing - Progress: Goal set today Pt Will Perform Lower Body Dressing: with modified independence;Sit to stand from chair ADL Goal: Lower Body Dressing - Progress: Goal set today Pt Will Transfer to Toilet: with modified independence;Ambulation;with DME ADL Goal: Toilet Transfer - Progress: Goal set today Pt Will Perform Toileting - Clothing Manipulation: with modified independence;Standing ADL Goal: Toileting - Clothing Manipulation - Progress: Goal set today Pt Will Perform Toileting - Hygiene: with modified independence;Sit to stand from 3-in-1/toilet ADL Goal: Toileting - Hygiene - Progress: Goal set today Arm Goals Additional Arm Goal #1: indep with written HEP for ROM, positining and sling mgnt with wriiten cues. Arm Goal: Additional Goal #1 - Progress: Goal set today  OT Evaluation Precautions/Restrictions  Precautions Precautions: Shoulder Precaution Booklet Issued: Yes (comment) Required Braces  or Orthoses: Yes Restrictions Weight Bearing Restrictions:  (sling) Prior Functioning Home Living Lives With: Alone Receives Help From: Family Type of Home:  House Home Layout: One level Home Access: Stairs to enter [employer] of Steps: 3 Bathroom Shower/Tub: Psychologist, counselling;Door Bathroom Toilet: Handicapped height Bathroom Accessibility: Yes How Accessible: Accessible via walker Prior Function Level of Independence: Independent with basic ADLs;Independent with homemaking with ambulation;Independent with gait;Independent with transfers Able to Take Stairs?: Yes Driving: Yes Vocation: Retired ADL ADL Eating/Feeding: Simulated;Set up Where Assessed - Eating/Feeding: Chair Grooming: Minimal assistance;Simulated Where Assessed - Grooming: Sitting, chair Upper Body Bathing: Simulated;Moderate assistance Where Assessed - Upper Body Bathing: Sitting, chair;Supported Lower Body Bathing: Simulated;Minimal assistance Where Assessed - Lower Body Bathing: Sit to stand from chair Upper Body Dressing: Simulated;Moderate assistance Where Assessed - Upper Body Dressing: Sitting, chair Lower Body Dressing: Simulated;Minimal assistance Where Assessed - Lower Body Dressing: Sit to stand from chair;Unsupported Toilet Transfer: Simulated;Minimal assistance Toilet Transfer Method: Proofreader: Regular height toilet Toileting - Clothing Manipulation: Simulated;Minimal assistance Where Assessed - Glass blower/designer Manipulation: Standing Toileting - Hygiene: Simulated;Independent Where Assessed - Toileting Hygiene: Standing Tub/Shower Transfer: Not assessed Ambulation Related to ADLs: supervision ADL Comments: limited at this time due to pain and nausea Vision/Perception  Vision - History Baseline Vision: No visual deficits Perception Perception: Within Functional Limits Praxis Praxis: Intact Cognition Cognition Arousal/Alertness: Awake/alert Overall Cognitive Status: Appears within functional limits for tasks assessed Orientation Level: Oriented X4 Sensation/Coordination Sensation Light Touch: Appears  Intact Coordination Gross Motor Movements are Fluid and Coordinated: No Fine Motor Movements are Fluid and Coordinated: No Coordination and Movement Description: due to surgery, positining and sling Extremity Assessment RUE Assessment RUE Assessment: Within Functional Limits LUE Assessment LUE Assessment: Exceptions to [facility] Mobility  Bed Mobility Bed Mobility: Yes (supervision) Transfers Transfers: Yes (min A/steady) Exercises Shoulder Exercises Shoulder Flexion: AAROM;PROM;Supine (to 30 degrees) Shoulder ABduction: PROM;AAROM;Seated (demonstrated but not performed) Shoulder External Rotation: PROM;AAROM;Standing (0 - 20) Elbow Flexion: AROM;AAROM;10 reps;Right;Seated Elbow Extension: AROM;AAROM;Right;10 reps;Seated Wrist Flexion: AROM;10 reps;Right;Seated Wrist Extension: AROM;10 reps;Seated;Right Neck Flexion: AROM;10 reps Neck Extension: AROM;10 reps Neck Lateral Flexion - Right: AROM;10 reps Neck Lateral Flexion - Left: AROM;10 reps Other Exercises Other Exercises: pendulums - standing, 10 reps End of Session OT - End of Session Equipment Utilized During Treatment: Gait belt Activity Tolerance: Patient limited by pain Patient left: in chair;with call bell in reach Nurse Communication: Mobility status for transfers General Behavior During Session: [facility] for tasks performed Cognition: [facility] for tasks performed   Mali Eppard,HILLARY 02/10/2012, 12:20 PM  [facility], OTR/L  [phone] 02/10/2012

## 2012-02-10 NOTE — Progress Notes (Signed)
Jackie Keller  MRN: [id number] DOB/Age: 1941-06-26 71 y.o. Physician: Jacquelyne Balint Procedure: Procedure(s) (LRB): REVERSE SHOULDER ARTHROPLASTY (Right)     Subjective: In miserable pain, tearful, unable to get relief.   Vital Signs Temp:  [97.2 F (36.2 C)-98.4 F (36.9 C)] 98.4 F (36.9 C) (03/08 0509) Pulse Rate:  [64-85] 79  (03/08 0509) Resp:  [15-19] 18  (03/08 0509) BP: (114-165)/(38-82) 114/38 mmHg (03/08 0509) SpO2:  [95 %-100 %] 96 % (03/08 0509)  Lab Results  Basename 02/07/12 1220  WBC 5.1  HGB 13.5  HCT 39.1  PLT 310   BMET  Basename 02/07/12 1220  NA 139  K 3.9  CL 102  CO2 27  GLUCOSE 93  BUN 8  CREATININE 0.58  CALCIUM 10.8*   No results found for this basename: inr     Exam Right shoulder dressing dry. NVI Pt tearful but A&O        Plan Pt has several intolerances to meds and unfortunately was only given IV meds through night which never allowed [place] to get ahead of the pain. Will ask RN to begin oral meds and add Valium. DC tomorrow if in better control.  Trevonne Nyland for Dr.Kevin Supple 02/10/2012, 8:15 AM

## 2012-02-11 NOTE — Progress Notes (Signed)
   CARE MANAGEMENT NOTE 02/11/2012  Patient:  Jackie Keller, Jackie Keller   Account Number:  [id number]  Date Initiated:  02/11/2012  Documentation initiated by:  Tera Mater  Subjective/Objective Assessment:   71yo female admitted to have shoulder surgery.     Action/Plan:   Discharge plannin   Anticipated DC Date:  02/11/2012   Anticipated DC Plan:  HOME W HOME HEALTH SERVICES      DC Planning Services  CM consult      Choice offered to / List presented to:          [facility] arranged  HH-2 PT  HH-3 OT      [facility] agency  [facility]   Status of service:  Completed, signed off Medicare Important Message given?  NA - LOS <3 / Initial given by admissions (If response is "NO", the following Medicare IM given date fields will be blank) Date Medicare IM given:   Date Additional Medicare IM given:    Discharge Disposition:  HOME W HOME HEALTH SERVICES  Per UR Regulation:    Comments:  02/11/12 1442 Spoke with pt. about HH servies and pt. stated she had used [facility] in past and was satisfied with their services.  TC to [place] with [facility] to give referral for [facility] PT/OT. Tera Mater, RN, BSN Nurse Case Manager [phone]

## 2012-02-11 NOTE — Progress Notes (Signed)
Occupational Therapy Treatment Patient Details Name: Jackie Keller MRN: [id number] DOB: 08/21/1941 Today's Date: 02/11/2012  OT Assessment/Plan OT Assessment/Plan Comments on Treatment Session: Pt feeling much better today.  Pt setup/supervision level with ADLs. Reports that daughter will be able to spend first day or two with patient.  Will greatly benefit from [facility] as pt must be mod I/I with ADLs and HEP as she can only receive intermittent help from daughter and pt lives alone. OT Plan: Discharge plan remains appropriate OT Frequency: Min 2X/week Follow Up Recommendations: Home health OT Equipment Recommended: None recommended by OT OT Goals ADL Goals Pt Will Perform Grooming: with modified independence;Standing at sink ADL Goal: Grooming - Progress: Progressing toward goals Pt Will Perform Upper Body Bathing: with modified independence;Sitting at sink ADL Goal: Upper Body Bathing - Progress: Progressing toward goals Pt Will Perform Lower Body Bathing: with modified independence;Sit to stand from chair ADL Goal: Lower Body Bathing - Progress: Progressing toward goals Pt Will Perform Upper Body Dressing: with modified independence;Sitting, chair ADL Goal: Upper Body Dressing - Progress: Progressing toward goals Pt Will Perform Lower Body Dressing: with modified independence;Sit to stand from chair ADL Goal: Lower Body Dressing - Progress: Progressing toward goals Pt Will Transfer to Toilet: with modified independence;Ambulation;with DME ADL Goal: Toilet Transfer - Progress: Progressing toward goals Pt Will Perform Toileting - Clothing Manipulation: with modified independence;Standing ADL Goal: Toileting - Clothing Manipulation - Progress: Progressing toward goals Pt Will Perform Toileting - Hygiene: with modified independence;Sit to stand from 3-in-1/toilet ADL Goal: Toileting - Hygiene - Progress: Met Arm Goals Additional Arm Goal #1: indep with written HEP for ROM, positining and  sling mgnt with wriiten cues. Arm Goal: Additional Goal #1 - Progress: Progressing toward goals  OT Treatment Precautions/Restrictions  Precautions Precautions: Shoulder Required Braces or Orthoses: Yes Other Brace/Splint: RUE sling   ADL ADL Eating/Feeding: Performed;Supervision/safety Eating/Feeding Details (indicate cue type and reason): Pt opened can of safe with cueing for efficient technique using Rt. hand to stabilize can, Where Assessed - Eating/Feeding: Chair Grooming: Performed;Denture care;Brushing hair;Set up Grooming Details (indicate cue type and reason): Brushed hair standing at sink. Denture care sititng in chair. Where Assessed - Grooming: Standing at sink;Sitting, chair Upper Body Bathing: Performed;Chest;Right arm;Left arm;Abdomen;Set up Upper Body Bathing Details (indicate cue type and reason): cueing for correct technique while maintaining shoulder precautions Where Assessed - Upper Body Bathing: Sitting, bed Lower Body Bathing: Performed;Set up Where Assessed - Lower Body Bathing: Sit to stand from bed Upper Body Dressing: Performed;Set up Upper Body Dressing Details (indicate cue type and reason): setup to snap gown around LUE due to IV line.  cueing to thread RUE through gown sleeve using LUE Where Assessed - Upper Body Dressing: Sitting, bed Lower Body Dressing: Performed;Set up Lower Body Dressing Details (indicate cue type and reason): setup to retrieve clothing. Where Assessed - Lower Body Dressing: Sit to stand from bed Toilet Transfer: Performed;Supervision/safety Toilet Transfer Method: Proofreader: Regular height toilet Toileting - Clothing Manipulation: Performed;Supervision/safety Toileting - Clothing Manipulation Details (indicate cue type and reason): supervision for balance while pulling undergarment up/down over hips Where Assessed - Toileting Clothing Manipulation: Sit to stand from 3-in-1 or toilet Toileting -  Hygiene: Performed;Modified independent Where Assessed - Toileting Hygiene: Sit on 3-in-1 or toilet Ambulation Related to ADLs: supervision for safety with IV line ADL Comments: Pt donned/doffed RUE sling with min assist sitting EOB. Mobility  Bed Mobility Bed Mobility: Yes Supine to Sit: 5: Supervision Sit to  Supine: 5: Supervision Transfers Transfers: Yes Sit to Stand: 5: Supervision;From bed;From chair/3-in-1;From toilet;With upper extremity assist (LUE assist) Sit to Stand Details (indicate cue type and reason): supervision for safety Stand to Sit: 5: Supervision;To bed;To chair/3-in-1;To toilet;With upper extremity assist;With armrests (LUE assist) Stand to Sit Details: supervision for safety Exercises Shoulder Exercises Pendulum Exercise: PROM;Right;10 reps;Standing Shoulder Flexion: AAROM;PROM;Supine Shoulder ABduction: PROM;AAROM;Seated Shoulder External Rotation: PROM;AAROM;Standing (0-30) Elbow Flexion: AROM;AAROM;10 reps;Right;Seated Elbow Extension: AROM;AAROM;Right;10 reps;Seated Wrist Flexion: AROM;10 reps;Right;Seated Wrist Extension: AROM;10 reps;Seated;Right Digit Composite Flexion: AROM;Right;10 reps;Seated Composite Extension: AROM;Right;10 reps;Seated  End of Session OT - End of Session Equipment Utilized During Treatment: Other (comment) (RUE sling) Activity Tolerance: Patient tolerated treatment well Patient left: in chair;with call bell in reach General Behavior During Session: [facility] for tasks performed Cognition: [facility] for tasks performed   12:05 PM 02/11/2012 Cipriano Mile OTR/L Pager [phone] Office [phone]

## 2012-02-11 NOTE — Progress Notes (Signed)
Subjective: 2 Days Post-Op Procedure(s) (LRB): REVERSE SHOULDER ARTHROPLASTY (Right) Patient reports pain as mild.    Objective: Vital signs in last 24 hours: Temp:  [97.4 F (36.3 C)-98.9 F (37.2 C)] 98.4 F (36.9 C) (03/09 0528) Pulse Rate:  [72-91] 72  (03/09 0528) Resp:  [16-18] 16  (03/09 0528) BP: (120-148)/(39-70) 120/39 mmHg (03/09 0528) SpO2:  [90 %-94 %] 90 % (03/09 0528)  Intake/Output from previous day: 03/08 0701 - 03/09 0700 In: 1676.3 [P.O.:360; I.V.:1316.3] Out: -  Intake/Output this shift:    No results found for this basename: HGB:5 in the last 72 hours No results found for this basename: WBC:2,RBC:2,HCT:2,PLT:2 in the last 72 hours No results found for this basename: NA:2,K:2,CL:2,CO2:2,BUN:2,CREATININE:2,GLUCOSE:2,CALCIUM:2 in the last 72 hours No results found for this basename: LABPT:2,INR:2 in the last 72 hours  dressing dry and intact.  NVI R UE.  Assessment/Plan: 2 Days Post-Op Procedure(s) (LRB): REVERSE SHOULDER ARTHROPLASTY (Right) Discharge home with home health  Toni Arthurs 02/11/2012, 10:46 AM

## 2012-02-11 NOTE — Discharge Instructions (Signed)
   Kevin M. Supple, M.D., F.A.A.O.S. Orthopaedic Surgery Specializing in Arthroscopic and Reconstructive Surgery of the Shoulder and Knee [phone] [address] - [place], [place] [address] - Fax [phone]   POST-OP TOTAL SHOULDER REPLACEMENT/SHOULDER HEMIARTHROPLASTY INSTRUCTIONS  1. Call the office at [phone] to schedule your first post-op appointment 10-14 days from the date of your surgery.  2. Leave the steri-strips in place over your incisions when performing dressing changes and showering. You may remove your dressings and begin showering on day 5 from surgery. You can expect drainage that is bloody or yellow in nature that should gradually decrease from day of surgery. Change your dressing daily until drainage is completely resolved, then you may feel free to go without a bandage. You can also expect significant bruising around your shoulder that will drift down your arm and into your chest wall. This is very normal and should resolve over several days.  3. Wear your sling/immobilizer at all times except to perform the exercises below or to occasionally let your arm dangle by your side to stretch your elbow. You also need to sleep in your sling immobilizer until instructed otherwise.  4. Range of motion to your elbow, wrist, and hand are encouraged 3-5 times daily. Exercise to your hand and fingers helps to reduce swelling you may experience.  5. Utilize ice to the shoulder 3-5 times minimum a day and additionally if you are experiencing pain.  6. Prescriptions for a pain medication and a muscle relaxant are provided for you. It is recommended that if you are experiencing pain that you pain medication alone is not controlling, add the muscle relaxant along with the pain medication which can give additional pain relief. The first 1-2 days is generally the most severe of your pain and then should gradually decrease. As your pain lessens it is recommended that you  decrease your use of the pain medications to an "as needed basis'" only and to always comply with the recommended dosages of the pain medications.  7. Pain medications can produce constipation along with their use. If you experience this, the use of an over the counter stool softener or laxative daily is recommended.   8. For most patients, if insurance allows, home health services to include therapy has been arranged.  9. For additional questions or concerns, please do not hesitate to call the office. If after hours there is an answering service to forward your concerns to the physician on call.  POST-OP EXERCISES  Pendulum Exercises  Perform pendulum exercises while standing and bending at the waist. Support your uninvolved arm on a table or chair and allow your operated arm to hang freely. Make sure to do these exercises passively - not using you shoulder muscles.  Repeat 20 times. Do 3 sessions per day.      

## 2012-02-15 NOTE — Discharge Summary (Signed)
PATIENT ID:      Jackie Keller  MRN:     [id number] DOB/AGE:    1941-05-30 / 71 y.o.     DISCHARGE SUMMARY  ADMISSION DATE:    02/09/2012 DISCHARGE DATE:   02/15/2012   ADMISSION DIAGNOSIS: RIGHT SHOULDER ROTATOR CUFF TEAR AUTHROPATHY  (RIGHT SHOULDER ROTATOR CUFF TEAR AUTHROPATHY)  DISCHARGE DIAGNOSIS:  RIGHT SHOULDER ROTATOR CUFF TEAR AUTHROPATHY    ADDITIONAL DIAGNOSIS: Active Problems:  * No active hospital problems. *   Past Medical History  Diagnosis Date  . Asthma     DR Rutherford Nail  [facility] [facility])  . Shortness of breath     WITH EXERTION   . Pneumonia     HX PNEUMONIA    . Sleep apnea     [facility] [place]  . GERD (gastroesophageal reflux disease)   . Arthritis   . Headache     HX MIGRAINES  . Osteoporosis   . Visit for monitoring Reclast therapy     TAKES RECLAST  X 1 YEAR    PROCEDURE: Procedure(s): REVERSE SHOULDER ARTHROPLASTY on 02/09/2012  CONSULTS:     HISTORY:  See H&P in chart  HOSPITAL COURSE:  Jackie Keller is a 71 y.o. admitted on 02/09/2012 and found to have a diagnosis of RIGHT SHOULDER ROTATOR CUFF TEAR AUTHROPATHY.  After appropriate laboratory studies were obtained  they were taken to the operating room on 02/09/2012 and underwent Procedure(s): REVERSE SHOULDER ARTHROPLASTY.   They were given perioperative antibiotics:  Anti-infectives     Start     Dose/Rate Route Frequency Ordered Stop   02/09/12 2000   vancomycin (VANCOCIN) IVPB 1000 mg/200 mL premix        1,000 mg 200 mL/hr over 60 Minutes Intravenous Every 12 hours 02/09/12 1448 02/09/12 2114   02/08/12 1500   vancomycin (VANCOCIN) IVPB 1000 mg/200 mL premix        1,000 mg 200 mL/hr over 60 Minutes Intravenous  Once 02/08/12 1451 02/09/12 1015        . Blood products given:none  Pt had very difficult time with pain control first 24 hrs after surgery requiring medication modification and ultimately was in better control on POD 2 and stable for DC home.  The remainder of  the hospital course was dedicated to ambulation and strengthening.   The patient was discharged on 2nd day in  Good condition.

## 2012-10-06 IMAGING — CR DG CHEST 2V
2 series · 2 of 2 positions shown · non-contrast
Comparison: None

CLINICAL DATA: Asthma exacerbation

CHEST - 2 VIEW

[view not recorded (1 of 2)]
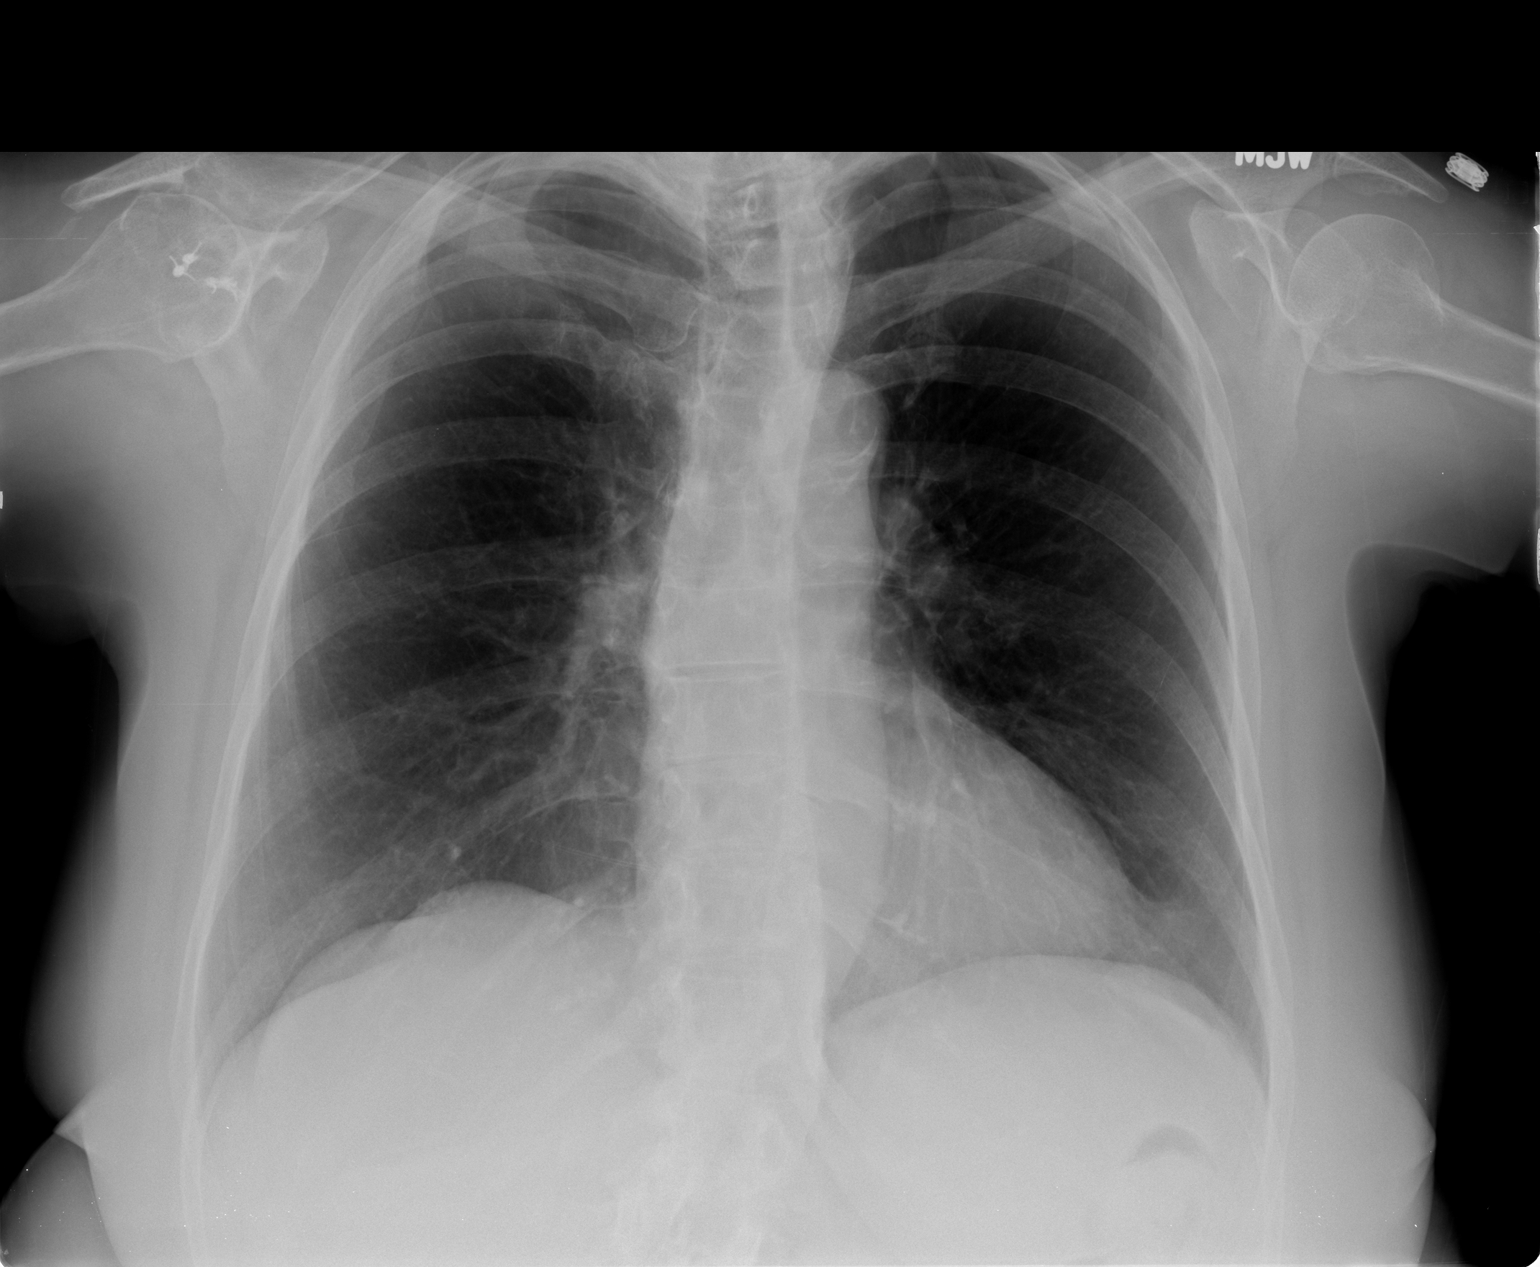

[view not recorded (2 of 2)]
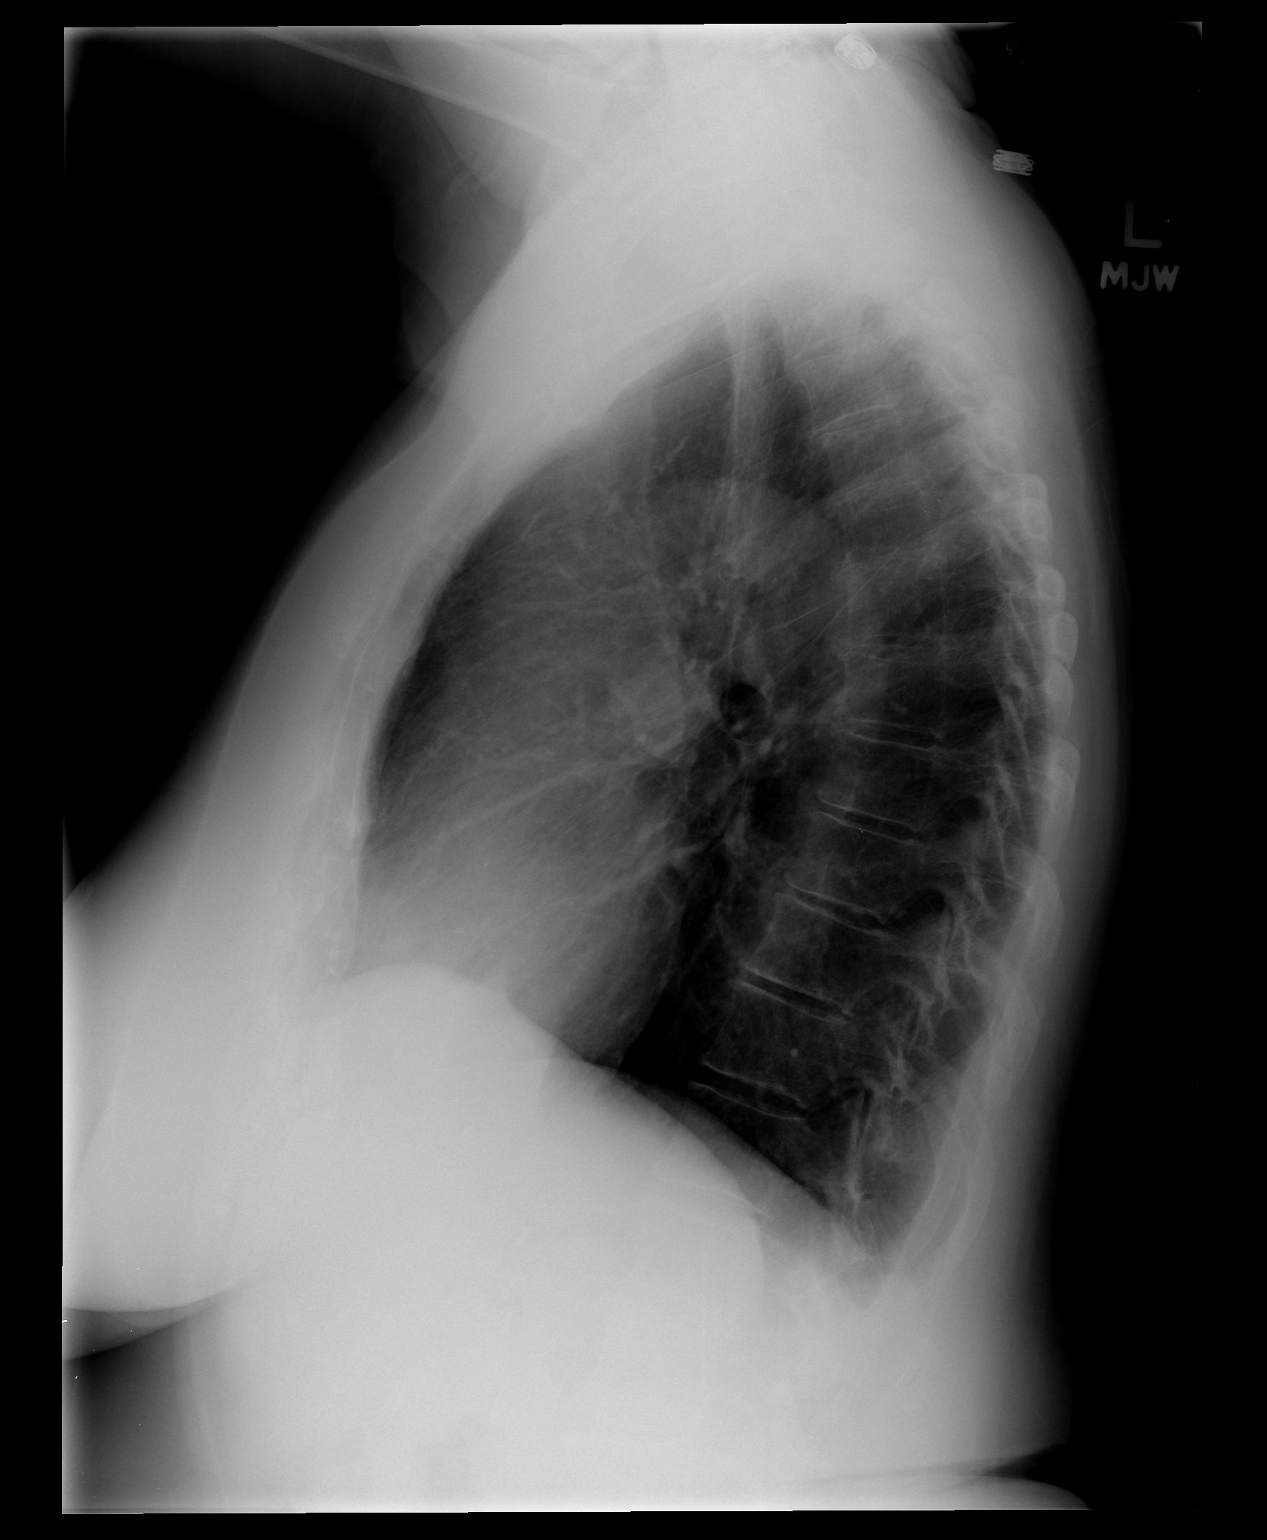

[2 of 2 positions shown; findings below may reference images not displayed]

FINDINGS: The heart size and mediastinal contours are within normal
limits.  Both lungs are clear.  The visualized skeletal structures
are unremarkable.
IMPRESSION: Negative exam.

## 2021-01-01 ENCOUNTER — Other Ambulatory Visit (HOSPITAL_COMMUNITY): Payer: Self-pay | Admitting: Sports Medicine

## 2021-01-01 ENCOUNTER — Ambulatory Visit (HOSPITAL_COMMUNITY)
Admission: RE | Admit: 2021-01-01 | Discharge: 2021-01-01 | Disposition: A | Discharge status: Home / Self Care | Payer: Medicare Other | Source: Ambulatory Visit | Attending: Internal Medicine | Admitting: Internal Medicine

## 2021-01-01 ENCOUNTER — Other Ambulatory Visit: Payer: Self-pay

## 2021-01-01 DIAGNOSIS — M79605 Pain in left leg: Secondary | ICD-10-CM

## 2021-01-01 DIAGNOSIS — M7989 Other specified soft tissue disorders: Secondary | ICD-10-CM

## 2022-11-03 ENCOUNTER — Ambulatory Visit (INDEPENDENT_AMBULATORY_CARE_PROVIDER_SITE_OTHER): Payer: Medicare Other

## 2022-11-03 ENCOUNTER — Ambulatory Visit: Payer: Medicare Other | Admitting: Sports Medicine

## 2022-11-03 ENCOUNTER — Encounter: Payer: Self-pay | Admitting: Sports Medicine

## 2022-11-03 DIAGNOSIS — M545 Low back pain, unspecified: Secondary | ICD-10-CM

## 2022-11-03 DIAGNOSIS — G8929 Other chronic pain: Secondary | ICD-10-CM | POA: Diagnosis not present

## 2022-11-03 DIAGNOSIS — M8000XA Age-related osteoporosis with current pathological fracture, unspecified site, initial encounter for fracture: Secondary | ICD-10-CM

## 2022-11-03 DIAGNOSIS — M8448XA Pathological fracture, other site, initial encounter for fracture: Secondary | ICD-10-CM

## 2022-11-03 DIAGNOSIS — M48061 Spinal stenosis, lumbar region without neurogenic claudication: Secondary | ICD-10-CM | POA: Diagnosis not present

## 2022-11-03 NOTE — Progress Notes (Signed)
In October; had 2 falls  Had an MRI done of lower back; She did have a sacral fracture in result from fall  History of RA Is in pain management for the pain; hydromorphone 3x/day

## 2022-11-03 NOTE — Progress Notes (Signed)
Jackie Keller - 81 y.o. female MRN [id number]  Date of birth: 1941/02/18  Office Visit Note: Visit Date: 11/03/2022 PCP: Patient, No Pcp Per Referred by: No ref. provider found  Subjective: Chief Complaint  Patient presents with   Lower Back - Pain   HPI: Jackie Keller is a pleasant 81 y.o. female who presents today for acute on chronic low back pain.  She has a longtime chronic history of low back pain, but states her pain was exacerbated with a fall in October and then more recently a fall the week before Thanksgiving.  Her pain today is over the left side of the low back radiating down the posterior thigh.  She has underwent a recent lumbar MRI on 10/19/2022.  This was ordered through [facility], I cannot see the images but do have access and to review the results report.  Impression demonstrated a large nondisplaced sacral insufficiency fracture of the right hemisacrum with intraosseous edema.  There is severe multilevel lumbar spondylosis with moderate dextroscoliosis.  There is multilevel spinal stenosis most severe at L4 and L5 and moderate to severe at L3-L4, L5-S1.  There is moderate to severe multilevel foraminal narrowing with possible impingement on exiting right L5 nerve root and reported undersurface contact of the exiting L4 nerve root bilaterally.  She denies any lower extremity weakness, no bowel or bladder incontinence.  She is managed on Dilaudid BID-TID and tylenol for pain control. Sees Dr. Ethelene Hal at [facility] who managed this for her. He reportedly did not provider her with sufficient options to treat her pain and so is requesting a 2nd opinion. She did have pain over the right hemipelvis after diagnosed sacral insufficiency fracture, although this is improving and she is having more left-sided chronic low back pain shooting down the posterior aspect of the thigh.  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  Several years ago she had epidural spinal  injections which stopped working.  She also tried a nerve ablation which she states did not help improve her pain.   Also has been on Reclast (Zoledronic acid) in the past, currently on Prolia (has 2nd injection in March 2024) for osteoporosis.    Pertinent ROS were reviewed with the patient and found to be negative unless otherwise specified above in HPI.   Assessment & Plan: Visit Diagnoses:  1. Chronic bilateral low back pain without sciatica   2. Sacral insufficiency fracture, initial encounter   3. Age-related osteoporosis with current pathological fracture, initial encounter   4. Spinal stenosis of lumbar region without neurogenic claudication    Plan: Alysse has a very complicated back history with significant spondylosis as well as multilevel spinal stenosis most severe at L4 and L5 based on her recent MRI.  Also has osteoporosis with an associated right sacral insufficiency fracture.  She was having pain from this, but is treating this conservatively and her pain here has improved but her more bothersome pain is her left-sided midline back pain around the L4-L5 region that will radiate down the left posterior thigh.  This does fit more of an L4 dermatomal pattern. She has followed up with Dr. Ethelene Hal for most of this and has had multiple ESI's and ablations years ago without much relief.  Her pain was exacerbated by her recent falls and per the patient Dr. Horald Chestnut and team did not have a good answer for what or her neck steps.  She presents today for second opinion.  In terms  of her sacral insufficiency fracture, she will continue with rest, pain medication and the use of her walker.  She may continue her scheduled Dilaudid as well as Tylenol, I did caution her not increasing her Dilaudid frequency more than 2-3 times as needed due to side effects. I would like to have her see my spine partner, Dr. Christell Constant, to review her lumbar MRI and to discuss if any surgical intervention will be warranted for the  back.  I could only see the report for her lumbar spine, recommend she and her daughter obtain a disc of that lumbar MRI to bring to their visit prior to seeing Dr. Christell Constant.  I did reassure her that she has no gross muscle weakness or red flag symptoms on her exam today.  For sacral insufficiency fracture: Treatment consists of rest, pain medications, and gradual walking with a walker or crutches.  Follow-up: Return for F/u with Dr. Christell Constant (bring in MRI of lumbar spine) for low back pain, sacral fx.   Meds & Orders: No orders of the defined types were placed in this encounter.   Orders Placed This Encounter  Procedures   XR Lumbar Spine 2-3 Views   XR Pelvis 1-2 Views     Procedures: No procedures performed      Clinical History: No specialty comments available.  She reports that she has quit smoking. She does not have any smokeless tobacco history on file. No results for input(s): "HGBA1C", "LABURIC" in the last 8760 hours.  Objective:    Physical Exam  Gen: Well-appearing, in no acute distress; non-toxic CV: Regular Rate. Well-perfused. Warm.  Resp: Breathing unlabored on room air; no wheezing. Psych: Fluid speech in conversation; appropriate affect; normal thought process Neuro: Sensation intact throughout. No gross coordination deficits.   Ortho Exam - Lumbar spine: There is tenderness to palpation over the left paraspinal musculature in the L4-L5 spinous region.  No midline spinous process TTP.  She has worsening of pain with extension.  Positive straight leg raise on the left.  She has trace patellar reflexes and absent S1 reflexes bilaterally.  She does have 5/5 strength of bilateral lower extremities in the L4-S1 nerve root distribution.  Imaging: XR Lumbar Spine 2-3 Views  Result Date: 11/03/2022 2 views including AP and lateral film of the lumbar spine were ordered and reviewed by myself.  There is poor penetrance of the x-ray today with incomplete visualization of the  entire lumbar spine, however there is notable multilevel lumbar spondylosis.  There is a notable dextroscoliosis.  There is grade 1 L4 on L5 anterolisthesis, and even more prominent L5 on S1 anterolisthesis.  XR Pelvis 1-2 Views  Result Date: 11/03/2022 1 view, AP of the pelvis was ordered and reviewed by myself.  X-rays demonstrate some notable sclerosis of the right SI joint and sacral ala.  No clear fracture noted although poor penetrance.  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   Past Medical/Family/Surgical/Social History: Medications & Allergies reviewed per EMR, new medications updated. There are no problems to display for this patient.  Past Medical History:  Diagnosis Date   Arthritis    Asthma    DR Rutherford Nail  [facility])   GERD (gastroesophageal reflux disease)    Headache(784.0)    HX MIGRAINES   Osteoporosis    Pneumonia    HX PNEUMONIA     Shortness of breath    WITH EXERTION    Sleep apnea  [facility] [place]   Visit for monitoring Reclast therapy    TAKES RECLAST  X 1 YEAR   History reviewed. No pertinent family history. Past Surgical History:  Procedure Laterality Date   DILATION AND CURETTAGE OF UTERUS     JOINT REPLACEMENT     BIL KNEE REPLACEMENT 07+08 FORSYTHE   REVERSE SHOULDER ARTHROPLASTY  02/09/2012   Procedure: REVERSE SHOULDER ARTHROPLASTY;  Surgeon: Senaida Lange, MD;  Location: [facility] OR;  Service: Orthopedics;  Laterality: Right;   ROTATOR CUFF REPAIR     RIGHT SHOULDER  2007   TONSILLECTOMY     PART OF TONSILS + ADENOIDS   Social History   Occupational History   Not on file  Tobacco Use   Smoking status: Former   Smokeless tobacco: Not on file  Substance and Sexual Activity   Alcohol use: No   Drug use: No   Sexual activity: Yes    Birth control/protection: Post-menopausal   I spent 50 minutes in the care of the patient today including face-to-face time, preparation to see the  patient, as well as review of external notes, result report from her previous lumbar MRI from outside facility, counseling and educating the patient and her daughter on the treatment plan and possible plan interventions; care coordination of obtaining MRI disc for our viewing prior to her spine appointment with Dr. Christell Constant for the above diagnoses.    Madelyn Brunner, DO Primary Care Sports Medicine Physician  [facility] - Orthopedics  This note was dictated using Dragon naturally speaking software and may contain errors in syntax, spelling, or content which have not been identified prior to signing this note.

## 2022-11-17 ENCOUNTER — Encounter: Payer: Self-pay | Admitting: Orthopedic Surgery

## 2022-11-17 ENCOUNTER — Ambulatory Visit: Payer: Medicare Other | Admitting: Orthopedic Surgery

## 2022-11-17 ENCOUNTER — Ambulatory Visit: Payer: Self-pay

## 2022-11-17 VITALS — BP 160/72 | HR 90 | Ht 61.0 in | Wt 139.2 lb

## 2022-11-17 DIAGNOSIS — M7062 Trochanteric bursitis, left hip: Secondary | ICD-10-CM

## 2022-11-17 DIAGNOSIS — G8929 Other chronic pain: Secondary | ICD-10-CM | POA: Diagnosis not present

## 2022-11-17 DIAGNOSIS — M545 Low back pain, unspecified: Secondary | ICD-10-CM

## 2022-11-17 NOTE — Progress Notes (Signed)
Orthopedic Spine Surgery Office Note  Assessment: Patient is a 81 y.o. female with right sacral fracture which is healing as expected and chronic left thigh pain. Has scoliosis and several areas of stenosis, but also was tender of the trochanteric bursa on that side and she said that is where it hurts the most. Working diagnosis of trochanteric bursitis   Plan: -She is currently seeing Dr. Ethelene Hal.  He has been helping with some of her pain management.  She said that she has been needing hydromorphone from her primary care provider.  I recommended that she continue to get her pain medications from another provider.  I told her if she needed long-term pain management and narcotics, I would refer her to pain management.  She does not need that referral at this time. -In regards to her left thigh pain, this could be radicular in nature but she also had significant tenderness palpation over the trochanteric bursa and said that this was the most painful area.  I explained that since this is a common diagnosis and her exam seems consistent with that, that I would start with a diagnostic and therapeutic injection to the left trochanteric bursa.  This was done today in the office. -Provided with her with a handout to avoid falls.  She is already being treated for osteoporosis -Will continue expectant management for her right sacral fracture -Patient should return to office in 6 weeks, repeat x-rays of lumbar spine at next visit: Left hip AP and lateral   Patient expressed understanding of the plan and all questions were answered to the patient's satisfaction.   Left hip trochanteric bursa injection: After discussing the risk benefits and alternatives of the proposed left greater trochanter injection, patient elected to proceed.  The skin over the greater trochanter was prepped with alcohol.  A 20-gauge needle was used to enter the trochanteric bursa under standard sterile technique.  1 cc of methylprednisolone  and 1 cc of 0.5% bupivacaine was injected into the trochanteric bursa.  The needle was withdrawn.  A Band-Aid was applied.  Patient tolerated the procedure well.  ___________________________________________________________________________   History:  Patient is a 81 y.o. female who presents today for lumbar spine and bilateral hip pain.  Patient had a fall back on 09/14/2022 and another fall on 09/26/2022.  She noted acute onset of right sided low back and hip pain.  This pain has been getting progressively better with time.  She still has it periodically but her chronically prescribed pain medication is helping significantly.  She had an MRI that showed a sacral insufficiency fracture on the right side.  She has decreased sensation in her right foot but no other numbness or paresthesias in the remainder of the extremity  In regards to her left side, she has left-sided low back pain near the SI joint that radiates into the left lateral thigh.  It is worse over the greater trochanter.  She feels that there is swelling in that area as well.  Pain is felt on a daily basis.  Her current pain medications are not providing her with adequate relief.  Pain does not radiate past the knee.  She has decreased sensation in her left foot but no other numbness or paresthesias in the remainder of the extremity   Weakness: Denies Symptoms of imbalance: Yes, has been getting worse with age.  Is using a walker now Paresthesias and numbness: Yes, her bilateral feet have decree sensation.  No other numbness or paresthesias Bowel or bladder  incontinence: Denies Saddle anesthesia: Denies  Treatments tried: Pain management (hydromorphone), epidural steroid, Tylenol, activity modification  Review of systems: Denies fevers and chills, night sweats, unexplained weight loss, history of cancer.  Has had pain that wakes her at night  Past medical history: GERD Osteoporosis Irritable bowel syndrome Rheumatoid  arthritis Sleep apnea Chronic pain Migraines   Allergies: Cephalosporins, glycopyrrolate, sulfa, gabapentin, erythromycin  Past surgical history:  Bilateral cataract surgery Total knee arthroplasty bilaterally Right shoulder arthroplasty Dilation and curettage Tonsillectomy and adenoidectomy  Social history: Denies use of nicotine product (smoking, vaping, patches, smokeless) Alcohol use: Denies Denies recreational drug use   Physical Exam:  General: no acute distress, appears stated age Neurologic: alert, answering questions appropriately, following commands Respiratory: unlabored breathing on room air, symmetric chest rise Psychiatric: appropriate affect, normal cadence to speech   MSK (spine):  -Strength exam      Left  Right EHL    5/5  5/5 TA    5/5  5/5 GSC    5/5  5/5 Knee extension  5/5  5/5 Hip flexion   5/5  5/5  -Sensory exam    Sensation intact to light touch in L3-S1 nerve distributions of bilateral lower extremities  -Achilles DTR: 1/4 on the left, 1/4 on the right -Patellar tendon DTR: 1/4 on the left, 1/4 on the right  -Straight leg raise: negative -Contralateral straight leg raise: negative -Clonus: no beats bilaterally  -Left hip exam: No pain through range of motion, negative Stinchfield, tender to palpation over the greater trochanter, no other tenderness palpation over the remainder of the hip -Right hip exam: No pain to range of motion, negative Stinchfield  Imaging: X-ray of the lumbar spine from 11/17/2022 and was independently reviewed and interpreted, showing lumbar degenerative scoliosis with apex to the right.  Spondylolisthesis at L4-5 and L5-S1.  Disc height loss at multiple levels but most notable at L3-4, L4-5, L5-S1.  L4-5 spondylolisthesis just about 2 mm between flexion extension.  The L5-S1 spondylolisthesis shifts about 1 mm between flexion extension.  MRI of the lumbar spine from 10/19/2022 was independently reviewed and  interpreted, showing right-sided sacral insufficiency fracture with T2 signal around the fracture.  Spondylolisthesis at L4-5 and L5-S1.  Lateral recess stenosis at L3-4.  Central lateral recess stenosis at L4-5 and L5-S1.  Multiple levels with foraminal stenosis.   Patient name: Jackie Keller Patient MRN: [id number] Date of visit: 11/17/22

## 2022-12-29 ENCOUNTER — Ambulatory Visit: Payer: Medicare Other | Admitting: Orthopedic Surgery

## 2022-12-29 ENCOUNTER — Ambulatory Visit (INDEPENDENT_AMBULATORY_CARE_PROVIDER_SITE_OTHER): Payer: Medicare Other

## 2022-12-29 DIAGNOSIS — M7062 Trochanteric bursitis, left hip: Secondary | ICD-10-CM

## 2022-12-29 DIAGNOSIS — G8929 Other chronic pain: Secondary | ICD-10-CM

## 2022-12-29 DIAGNOSIS — M545 Low back pain, unspecified: Secondary | ICD-10-CM | POA: Diagnosis not present

## 2022-12-29 NOTE — Progress Notes (Signed)
Orthopedic Spine Surgery Office Note  Assessment: Patient is a 82 y.o. female with multiple possible etiologies of her pain.  She has a right sacral fracture, trochanteric bursitis on the left side, and a scoliosis with several areas of stenosis.  She has had significant improvement with a trochanteric injection and physical therapy  Plan: -Explained that initially conservative treatment is tried as a significant number of patients may experience relief with these treatment modalities. Discussed that the conservative treatments include:  -activity modification  -physical therapy  -over the counter pain medications  -medrol dosepak  -lumbar steroid injections -Patient has tried pain management (hydromorphone), epidural steroid, Tylenol, activity modification, physical therapy, trochanteric injection -Recommended she continue with physical therapy and do the home exercises regularly.  We can do periodic trochanteric injections for symptomatic flares -Patient should return to office in 6 weeks, x-rays at next visit: AP/inlet/outlet pelvis   Patient expressed understanding of the plan and all questions were answered to the patient's satisfaction.   ___________________________________________________________________________  History: Patient is a 82 y.o. female who has been previously seen in the office for symptoms of left sided low back and left hip pain. At our last visit, a left trochanteric bursa injection was performed and PT was recommended. She has noticed significant improvement since our last visit in terms of her pain. She is walking more and is able to do her home exercises. She is not having to hold her leg as much or a wall to support herself. Is pleased with her progress so far. Still having left sided low back pain and left lateral hip pain. No pain radiating down further than the lateral hip.   Previous treatments: pain management (hydromorphone), epidural steroid, Tylenol, activity  modification, physical therapy, trochanteric injection  COPY OF FIRST NOTE Patient is a 82 y.o. female who presents today for lumbar spine and bilateral hip pain.  Patient had a fall back on 09/14/2022 and another fall on 09/26/2022.  She noted acute onset of right sided low back and hip pain.  This pain has been getting progressively better with time.  She still has it periodically but her chronically prescribed pain medication is helping significantly.  She had an MRI that showed a sacral insufficiency fracture on the right side.  She has decreased sensation in her right foot but no other numbness or paresthesias in the remainder of the extremity   In regards to her left side, she has left-sided low back pain near the SI joint that radiates into the left lateral thigh.  It is worse over the greater trochanter.  She feels that there is swelling in that area as well.  Pain is felt on a daily basis.  Her current pain medications are not providing her with adequate relief.  Pain does not radiate past the knee.  She has decreased sensation in her left foot but no other numbness or paresthesias in the remainder of the extremity     Weakness: Denies Symptoms of imbalance: Yes, has been getting worse with age.  Is using a walker now Paresthesias and numbness: Yes, her bilateral feet have decree sensation.  No other numbness or paresthesias Bowel or bladder incontinence: Denies Saddle anesthesia: Denies END OF COPY  Physical Exam:  General: no acute distress, appears stated age Neurologic: alert, answering questions appropriately, following commands Respiratory: unlabored breathing on room air, symmetric chest rise Psychiatric: appropriate affect, normal cadence to speech   MSK (spine):  -Strength exam      Left  Right EHL    5/5  5/5 TA    5/5  5/5 GSC    5/5  5/5 Knee extension  5/5  5/5 Hip flexion   5/5  5/5  -Sensory exam    Sensation intact to light touch in L3-S1 nerve  distributions of bilateral lower extremities  -Achilles DTR: 1/4 on the left, 1/4 on the right -Patellar tendon DTR: 1/4 on the left, 1/4 on the right  -Straight leg raise: negative -Contralateral straight leg raise: negative -Clonus: no beats bilaterally  Imaging: X-ray of the lumbar spine from 11/17/2022 and was previously independently reviewed and interpreted, showing lumbar degenerative scoliosis with apex to the right.  Spondylolisthesis at L4-5 and L5-S1.  Disc height loss at multiple levels but most notable at L3-4, L4-5, L5-S1.  L4-5 spondylolisthesis just about 2 mm between flexion extension.  The L5-S1 spondylolisthesis shifts about 1 mm between flexion extension.   MRI of the lumbar spine from 10/19/2022 was previously independently reviewed and interpreted, showing right-sided sacral insufficiency fracture with T2 signal around the fracture.  Spondylolisthesis at L4-5 and L5-S1.  Lateral recess stenosis at L3-4.  Central lateral recess stenosis at L4-5 and L5-S1.  Multiple levels with foraminal stenosis.   Patient name: Jackie Keller Patient MRN: [id number] Date of visit: 12/29/22

## 2023-02-09 ENCOUNTER — Ambulatory Visit: Payer: Medicare Other | Admitting: Orthopedic Surgery

## 2023-02-16 ENCOUNTER — Ambulatory Visit: Payer: Medicare Other | Admitting: Orthopedic Surgery

## 2023-02-16 ENCOUNTER — Other Ambulatory Visit (INDEPENDENT_AMBULATORY_CARE_PROVIDER_SITE_OTHER): Payer: Medicare Other

## 2023-02-16 DIAGNOSIS — M8448XA Pathological fracture, other site, initial encounter for fracture: Secondary | ICD-10-CM | POA: Diagnosis not present

## 2023-02-16 NOTE — Progress Notes (Signed)
Orthopedic Spine Surgery Office Note   Assessment: Patient is a 82 y.o. female with multiple possible etiologies of her pain.  She has a right sacral fracture, trochanteric bursitis on the left side, and a scoliosis with several areas of stenosis.  Pain is tolerable with Tylenol and a heating pad   Plan: -Patient has tried pain management (hydromorphone), epidural steroid, Tylenol, activity modification, physical therapy, trochanteric injection -Recommended she continue with her physical therapy home exercise program, pain management, Tylenol as needed, and heating pad as needed -Patient should return to office on an as-needed basis.  If pain gets significant or she develops new symptoms, she should return to the office   Patient expressed understanding of the plan and all questions were answered to the patient's satisfaction.    ___________________________________________________________________________   History: Patient is a 82 y.o. female who has been previously seen in the office for symptoms of left sided low back and left hip pain. She has gotten relief with PT and trochanteric injection on the left side in the past.  Patient states pain is continue to get better.  She now is only taking a 325 mg Tylenol for when pain is felt and uses a heating pad.  She says that this treatment resolves her pain.  She is still having pain in her right lower back but this is improved significantly.  Her more significant pain is on the left side around the lateral aspect of the hip.  She thinks that this has gotten better though with time and some physical therapy.  No pain radiating past the lateral hip.  No new symptoms since she was seen last time.  Previous treatments: pain management (hydromorphone), epidural steroid, Tylenol, activity modification, physical therapy, trochanteric injection   Physical Exam:   General: no acute distress, appears stated age Neurologic: alert, answering questions  appropriately, following commands Respiratory: unlabored breathing on room air, symmetric chest rise Psychiatric: appropriate affect, normal cadence to speech     MSK (spine):   -Strength exam                                                   Left                  Right EHL                              5/5                  5/5 TA                                 5/5                  5/5 GSC                             5/5                  5/5 Knee extension            5/5                  5/5 Hip flexion  5/5                  5/5   -Sensory exam                           Sensation intact to light touch in L3-S1 nerve distributions of bilateral lower extremities   -Achilles DTR: 1/4 on the left, 1/4 on the right -Patellar tendon DTR: 1/4 on the left, 1/4 on the right   -Straight leg raise: negative -Contralateral straight leg raise: negative -Clonus: no beats bilaterally   Imaging: X-ray of the lumbar spine from 11/17/2022 and was previously independently reviewed and interpreted, showing lumbar degenerative scoliosis with apex to the right.  Spondylolisthesis at L4-5 and L5-S1.  Disc height loss at multiple levels but most notable at L3-4, L4-5, L5-S1.  L4-5 spondylolisthesis just about 2 mm between flexion extension.  The L5-S1 spondylolisthesis shifts about 1 mm between flexion extension.  XR of the pelvis taken on 02/16/2023 was independently reviewed and interpreted, showing no interval fracture displacement.  No new fracture seen.  Pelvic ring appears intact.   MRI of the lumbar spine from 10/19/2022 was previously independently reviewed and interpreted, showing right-sided sacral insufficiency fracture with T2 signal around the fracture.  Spondylolisthesis at L4-5 and L5-S1.  Lateral recess stenosis at L3-4.  Central lateral recess stenosis at L4-5 and L5-S1.  Multiple levels with foraminal stenosis.     Patient name: Jackie Keller Patient MRN:  [id number] Date of visit: 02/16/23
